# Patient Record
Sex: Female | Born: 1960 | Race: White | Hispanic: No | Marital: Single | Smoking: Never smoker
Health system: Southern US, Community
[De-identification: ages and names within clinical notes are randomized; demographics above are authoritative.]

## PROBLEM LIST (undated history)

## (undated) DIAGNOSIS — R3915 Urgency of urination: Secondary | ICD-10-CM

## (undated) DIAGNOSIS — F349 Persistent mood [affective] disorder, unspecified: Secondary | ICD-10-CM

## (undated) DIAGNOSIS — F70 Mild intellectual disabilities: Secondary | ICD-10-CM

## (undated) DIAGNOSIS — G809 Cerebral palsy, unspecified: Secondary | ICD-10-CM

---

## 2006-06-07 ENCOUNTER — Ambulatory Visit (HOSPITAL_COMMUNITY): Admission: RE | Admit: 2006-06-07 | Discharge: 2006-06-07 | Payer: Self-pay | Admitting: *Deleted

## 2006-07-13 ENCOUNTER — Encounter: Admission: RE | Admit: 2006-07-13 | Discharge: 2006-07-13 | Payer: Self-pay | Admitting: *Deleted

## 2007-08-02 IMAGING — MG MM DIAGNOSTIC LTD LEFT
2 series · 2 of 2 positions shown · non-contrast
Comparison: Patient's prior exams were obtained in [facility] but she does not know the 
facility.

[REDACTED] LEFT
CC and MLO view(s) were taken of the left breast.

LEFT BREAST ULTRASOUND
DIGITAL LIMITED LEFT DIAGNOSTIC MAMMOGRAM AND LEFT BREAST ULTRASOUND:
CLINICAL DATA: Screening call-back for left breast mass.

[L CC]
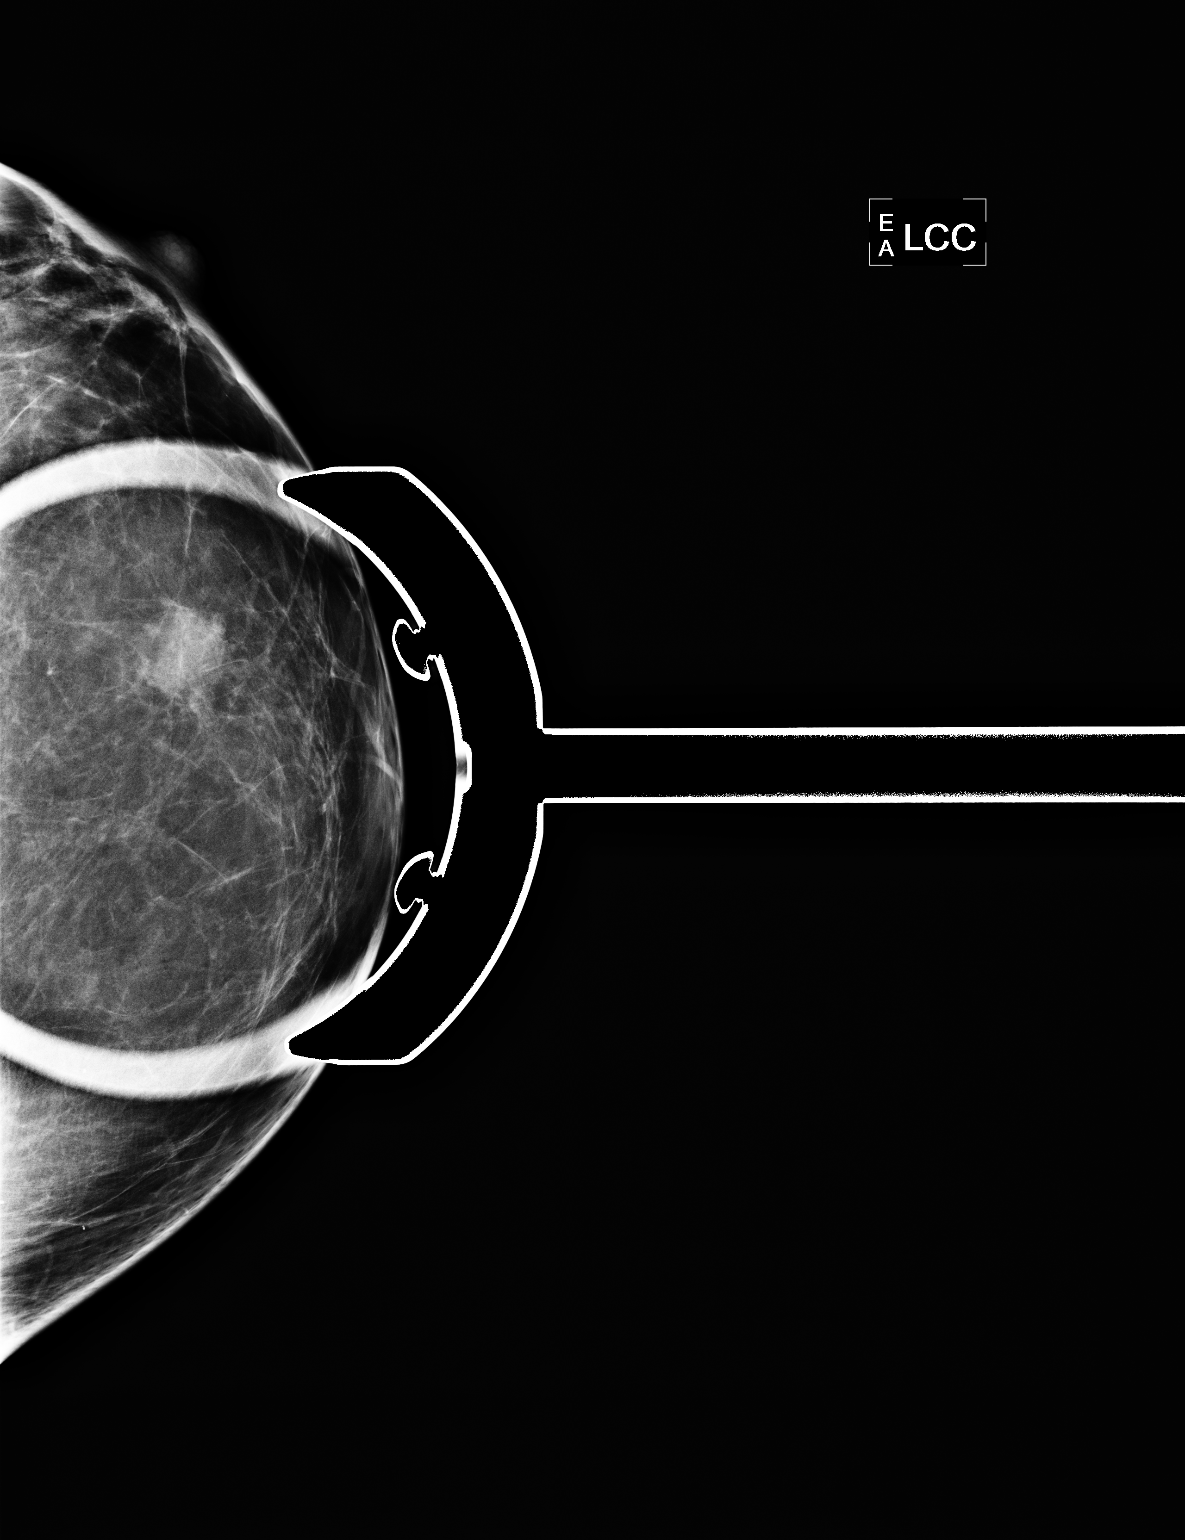

[L MLO]
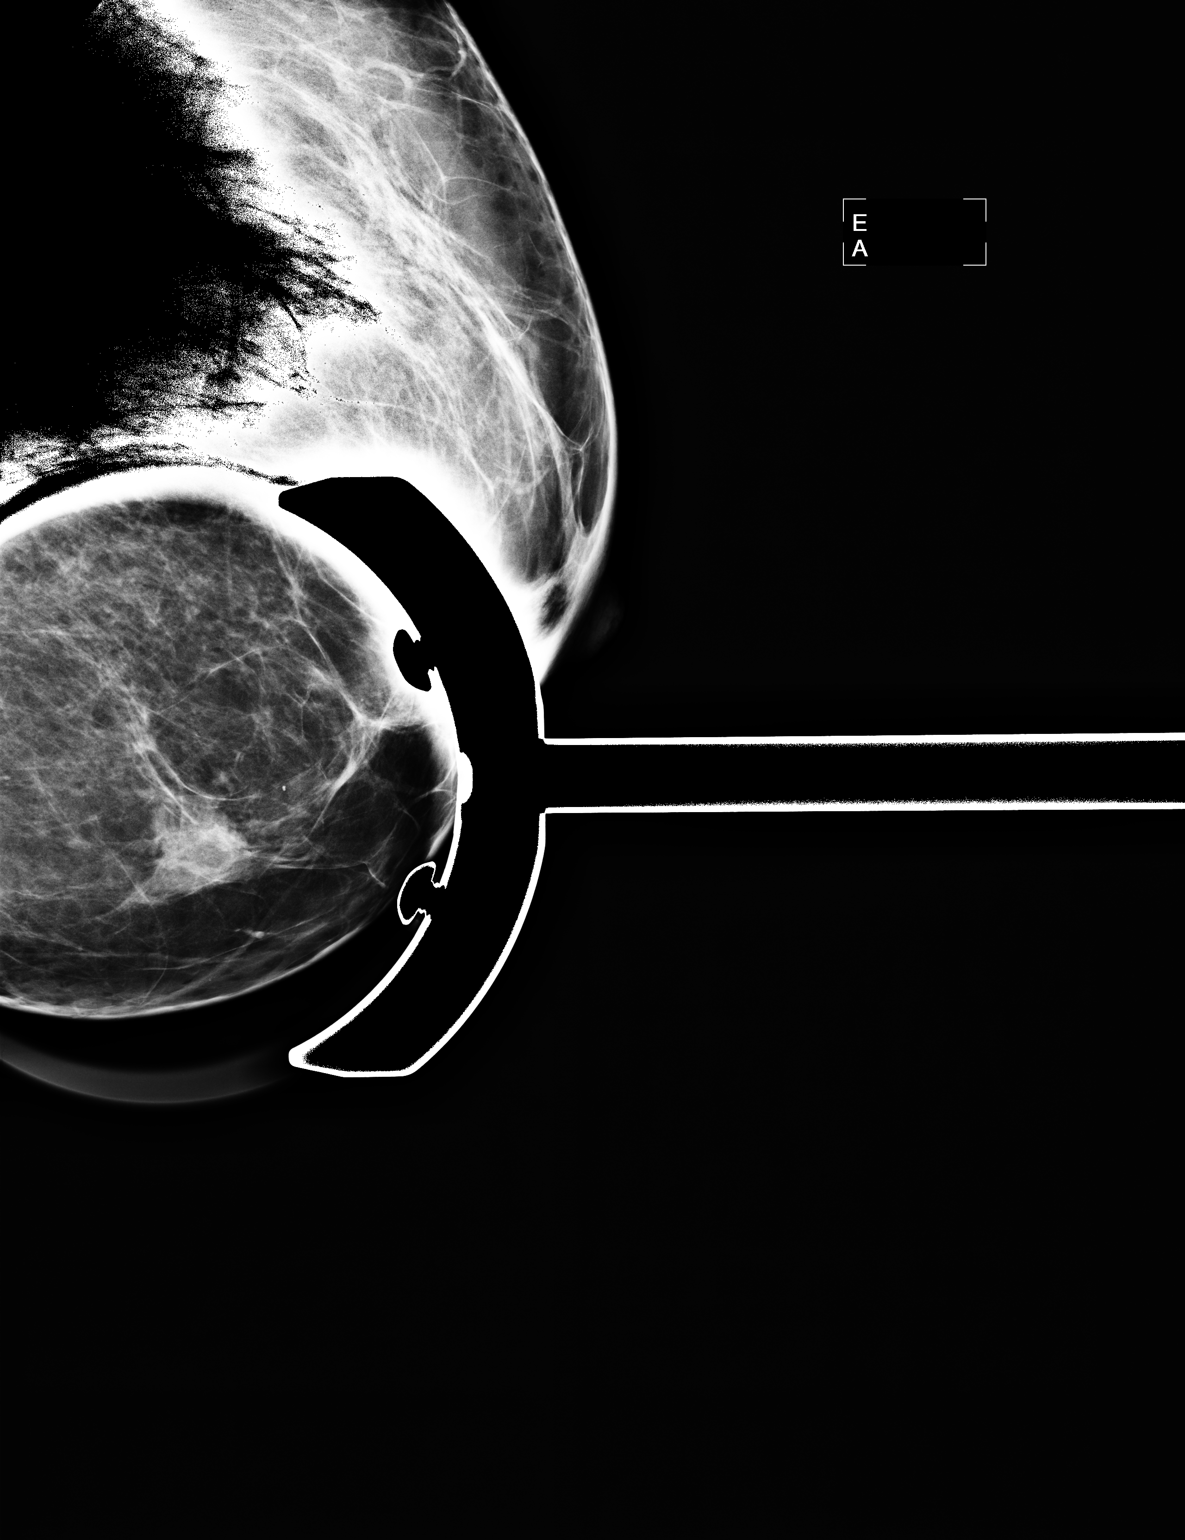

[2 of 2 positions shown; findings below may reference images not displayed]

She has asked us to contact her mother for information regarding finding these films.  
Comparison is made to screening mammogram at [facility] [HOSPITAL[facility]  06-07-06.

There is a benign-appearing intramammary lymph node in the left breast 9 o'clock location.  A
cm gently lobulated oval mass is present in the left breast 7-8 o'clock location.

Ultrasound of the left breast 7-8 o'clock location demonstrates a heterogeneous mass with dense 
posterior shadowing measuring 1.2 x 1.0 x 1.0 cm.  This corresponds to the mammographic finding.
IMPRESSION: Suspicious mass, left breast 8 o'clock location.  We will attempt to obtain outside films to 
determine interval stability.  If they cannot be obtained or if this is a new finding, 
ultrasound-guided biopsy would be recommended. The patient could not stay for this procedure today 
at any rate, and it is unclear at this point whether she can provide informed consent.  Findings 
and recommendations discussed with Dr. Ebony by Dr. Pearl on 07-13-06 at [DATE] p.m.

REVIEW OF OUTSIDE STUDY
CC and MLO view(s) were taken of the left breast.
Radiologist: Mohammadasif Syad, M.D. at this facility

Review Of Outside Study:

Since the prior exam, outside studies from [[facility]ED] [facility], [place] 03-14-02 have
become available for comparison.  The mass in the left lower inner quadrant is unchanged since 
that time. Given its greater than two year interval stability, no specific follow-up is required of
this area.  The patient will be due for screening mammography in one year.

Findings called to Maignal Labate, the patient's mother and guardian, by Dr. Pearl on 07-27-06.

ASSESSMENT: Negative - BI-RADS 1

Screening mammogram of both breasts in 1 year.
,

## 2007-12-06 ENCOUNTER — Encounter: Admission: RE | Admit: 2007-12-06 | Discharge: 2007-12-06 | Payer: Self-pay | Admitting: *Deleted

## 2009-02-04 ENCOUNTER — Encounter: Admission: RE | Admit: 2009-02-04 | Discharge: 2009-02-04 | Payer: Self-pay | Admitting: Family Medicine

## 2009-02-16 ENCOUNTER — Ambulatory Visit: Payer: Self-pay | Admitting: Surgery

## 2010-02-22 ENCOUNTER — Encounter: Admission: RE | Admit: 2010-02-22 | Discharge: 2010-02-22 | Payer: Self-pay | Admitting: Family Medicine

## 2011-01-16 ENCOUNTER — Encounter: Payer: Self-pay | Admitting: *Deleted

## 2011-01-16 ENCOUNTER — Encounter: Payer: Self-pay | Admitting: Family Medicine

## 2011-03-16 ENCOUNTER — Other Ambulatory Visit (HOSPITAL_COMMUNITY): Payer: Self-pay | Admitting: Family Medicine

## 2011-03-16 DIAGNOSIS — Z1231 Encounter for screening mammogram for malignant neoplasm of breast: Secondary | ICD-10-CM

## 2011-03-22 ENCOUNTER — Ambulatory Visit (HOSPITAL_COMMUNITY)
Admission: RE | Admit: 2011-03-22 | Discharge: 2011-03-22 | Disposition: A | Discharge status: Home / Self Care | Payer: Medicaid Other | Source: Ambulatory Visit | Attending: Family Medicine | Admitting: Family Medicine

## 2011-03-22 DIAGNOSIS — Z1231 Encounter for screening mammogram for malignant neoplasm of breast: Secondary | ICD-10-CM | POA: Insufficient documentation

## 2011-03-25 ENCOUNTER — Other Ambulatory Visit: Payer: Self-pay | Admitting: Family Medicine

## 2011-03-25 DIAGNOSIS — R928 Other abnormal and inconclusive findings on diagnostic imaging of breast: Secondary | ICD-10-CM

## 2011-04-05 ENCOUNTER — Ambulatory Visit
Admission: RE | Admit: 2011-04-05 | Discharge: 2011-04-05 | Disposition: A | Discharge status: Home / Self Care | Payer: Medicaid Other | Source: Ambulatory Visit | Attending: Family Medicine | Admitting: Family Medicine

## 2011-04-05 DIAGNOSIS — R928 Other abnormal and inconclusive findings on diagnostic imaging of breast: Secondary | ICD-10-CM

## 2011-05-10 NOTE — Assessment & Plan Note (Signed)
OFFICE VISIT   Gregory, Emily K  DOB:  05/08/1961                                       02/16/2009  ZOXWR#:[id number]   REASON FOR EVALUATION:  Blue feet.   HISTORY:  This is a 50 year old female resident at [facility] with  history of cerebral palsy and bound to a wheelchair.  I am asked to  evaluate her for cool blue extremities and to rule out peripheral  vascular disease.  The patient has poor muscle tone due to her cerebral  palsy.  She has been complaining of several months of her feet being  cold and blue.  She says that they do not hurt her.  There is no  numbness or tingling, there is mild swelling.  She has been trying TED  hose and compression stockings.   PAST MEDICAL HISTORY:  Cerebral palsy.   FAMILY HISTORY:  Noncontributory.   SOCIAL HISTORY:  She is single.  Does not smoke.  Does not drink.   REVIEW OF SYSTEMS:  All negative except for depression.   MEDICATIONS:  Vitamin E, Vitamin C, aspirin, Celexa, Caltrate.   ALLERGIES:  None.   EXAMINATION:  She is well-appearing, in no distress.  Cardiovascular:  Regular rate and rhythm, respirations nonlabored.  Extremities:  Cool to  the touch, I could palpate dorsalis pedis pulses bilaterally.  There is  no ulceration.  She can wiggle her toes.  She can feel her toes.   Diagnostic studies:  A duplex ultrasound was performed today which  reveals an ankle brachial index of 1.06 on the right and 1.15 on the  left.   ASSESSMENT/PLAN:  Blue cold feet.   I think the patient's discoloration and temper discrepancies on her feet  are certainly not related to any form of arterial insufficiency.  She  most likely has an autonomic nervous system dysfunction.  I do not think  this will cause her any problems since she does not have any pain.  We  could potentially treat this like to Raynaud's syndrome with calcium  channel blockers; however, I do not think her symptoms are severe enough  to  warrant this at this time.  Therefore, I would not recommend any  intervention at this time.  Obviously if she does have some swelling,  compression stockings would help.  Otherwise, she should try to avoid  cold environments.  She should keep her feet warm whenever possible.   Jorge Ny, MD  Electronically Signed   VWB/MEDQ  D:  02/16/2009  T:  02/17/2009  Job:  [id number]   cc:   Judge Stall, Dr.

## 2012-04-13 ENCOUNTER — Other Ambulatory Visit: Payer: Self-pay | Admitting: Family Medicine

## 2012-04-13 DIAGNOSIS — Z780 Asymptomatic menopausal state: Secondary | ICD-10-CM

## 2012-04-13 DIAGNOSIS — Z1231 Encounter for screening mammogram for malignant neoplasm of breast: Secondary | ICD-10-CM

## 2012-04-24 IMAGING — US UNKNOWN PR STUDY
1 series · 2 of 2 positions shown · non-contrast
Comparison: 03/22/2011, 02/22/2010, 02/04/2009, 12/06/2007.

CLINICAL DATA: Possible mass subareolar right breast identified on
recent screening mammogram.

DIGITAL DIAGNOSTIC RIGHT MAMMOGRAM WITHOUT CAD AND RIGHT BREAST
ULTRASOUND:

[Series 1: unknown pr study · 2 of 2 slices shown]
[im 1/2]
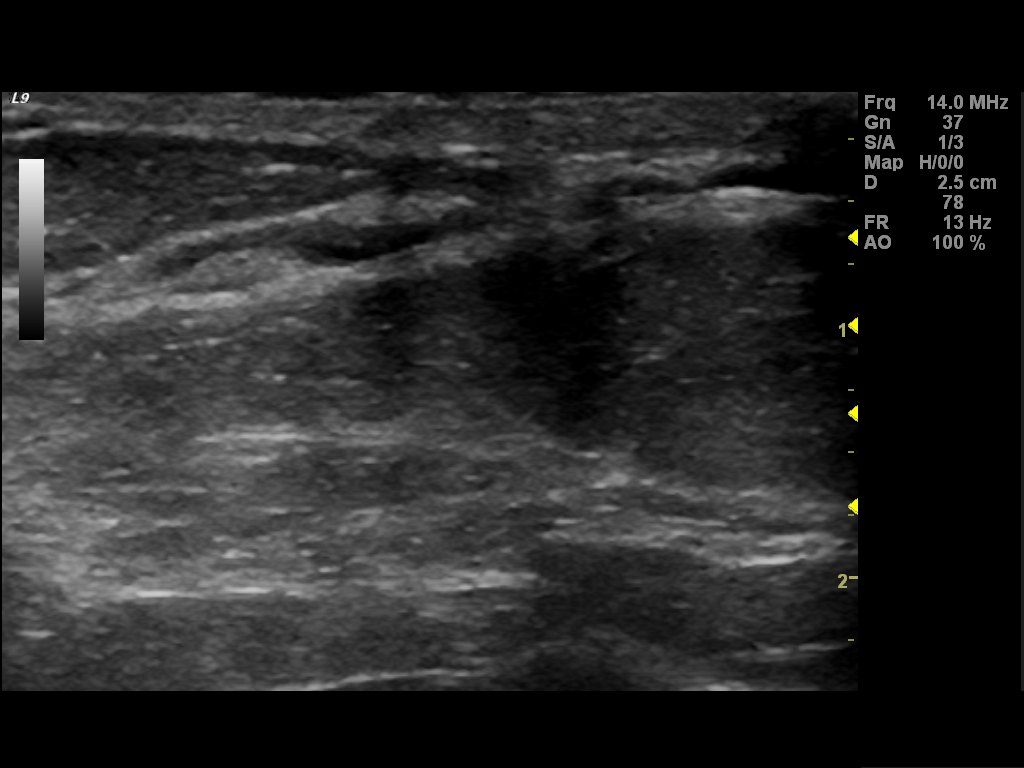
[im 2/2]
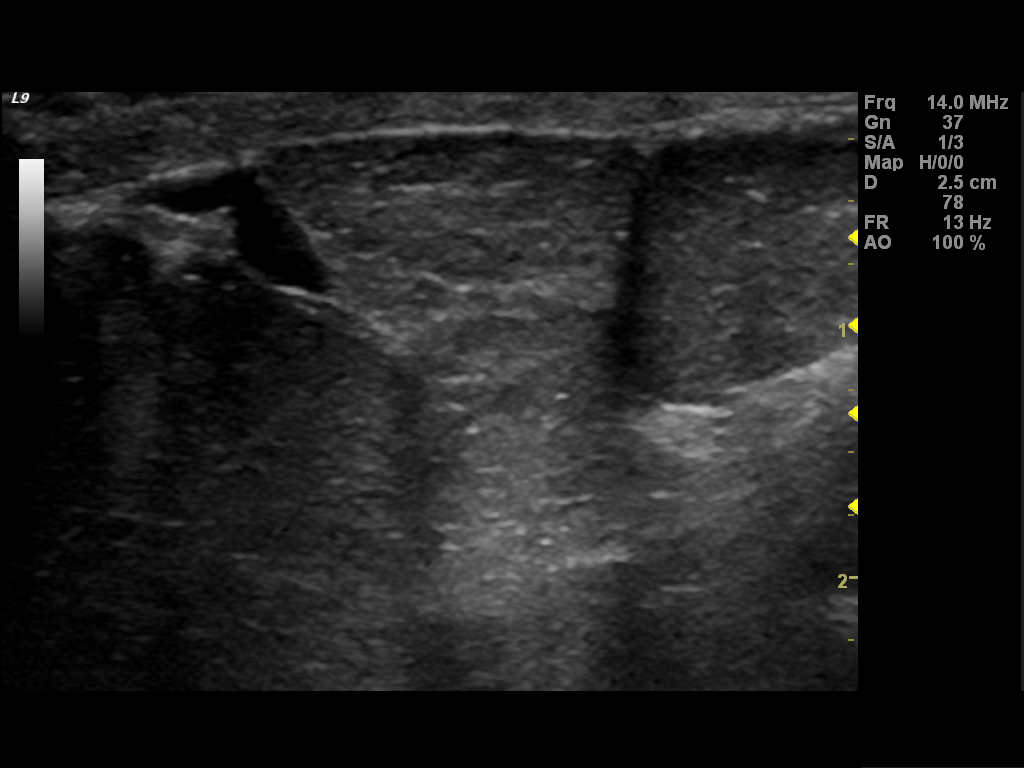

[2 of 2 positions shown; findings below may reference images not displayed]

FINDINGS: Focal spot compression views of the subareolar right
breast slightly lateral to the nipple show probable duct ectasia,
that appears similar compared to prior  prior exams.  No definite
mass is identified.

On physical exam, no mass is palpated in the periareolar right
breast.

Ultrasound is performed, showing mild subareolar duct ectasia.  No
evidence of malignancy.
IMPRESSION: No evidence of malignancy in the right breast.  Mild subareolar
duct ectasia.  Bilateral screening mammogram in 1 year is
recommended.

BI-RADS CATEGORY 2:  Benign finding(s).

## 2012-04-25 ENCOUNTER — Ambulatory Visit: Payer: Medicaid Other

## 2012-04-25 ENCOUNTER — Other Ambulatory Visit: Payer: Medicaid Other

## 2012-04-25 IMAGING — MG MM DIGITAL DIAG LTD R {BCG}
2 series · 2 of 2 positions shown · non-contrast
Comparison: 03/22/2011, 02/22/2010, 02/04/2009, 12/06/2007.

CLINICAL DATA: Possible mass subareolar right breast identified on
recent screening mammogram.

DIGITAL DIAGNOSTIC RIGHT MAMMOGRAM WITHOUT CAD AND RIGHT BREAST
ULTRASOUND:

[R CC]
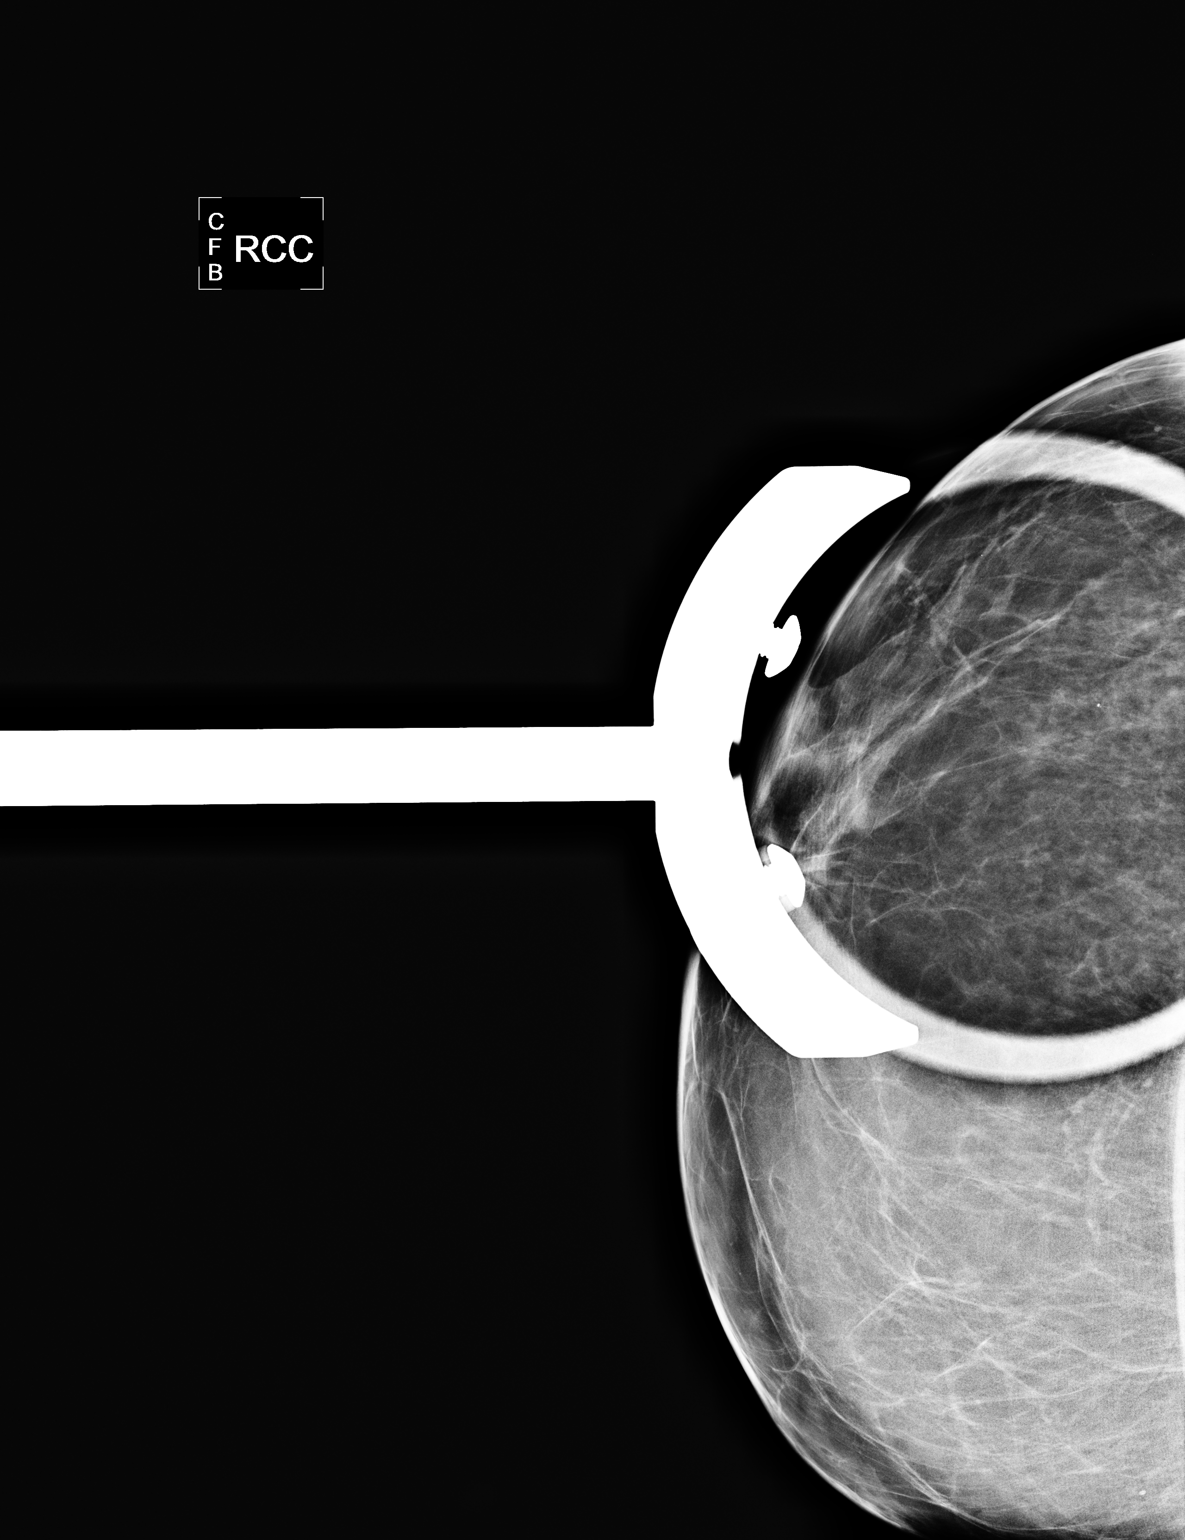

[R MLO]
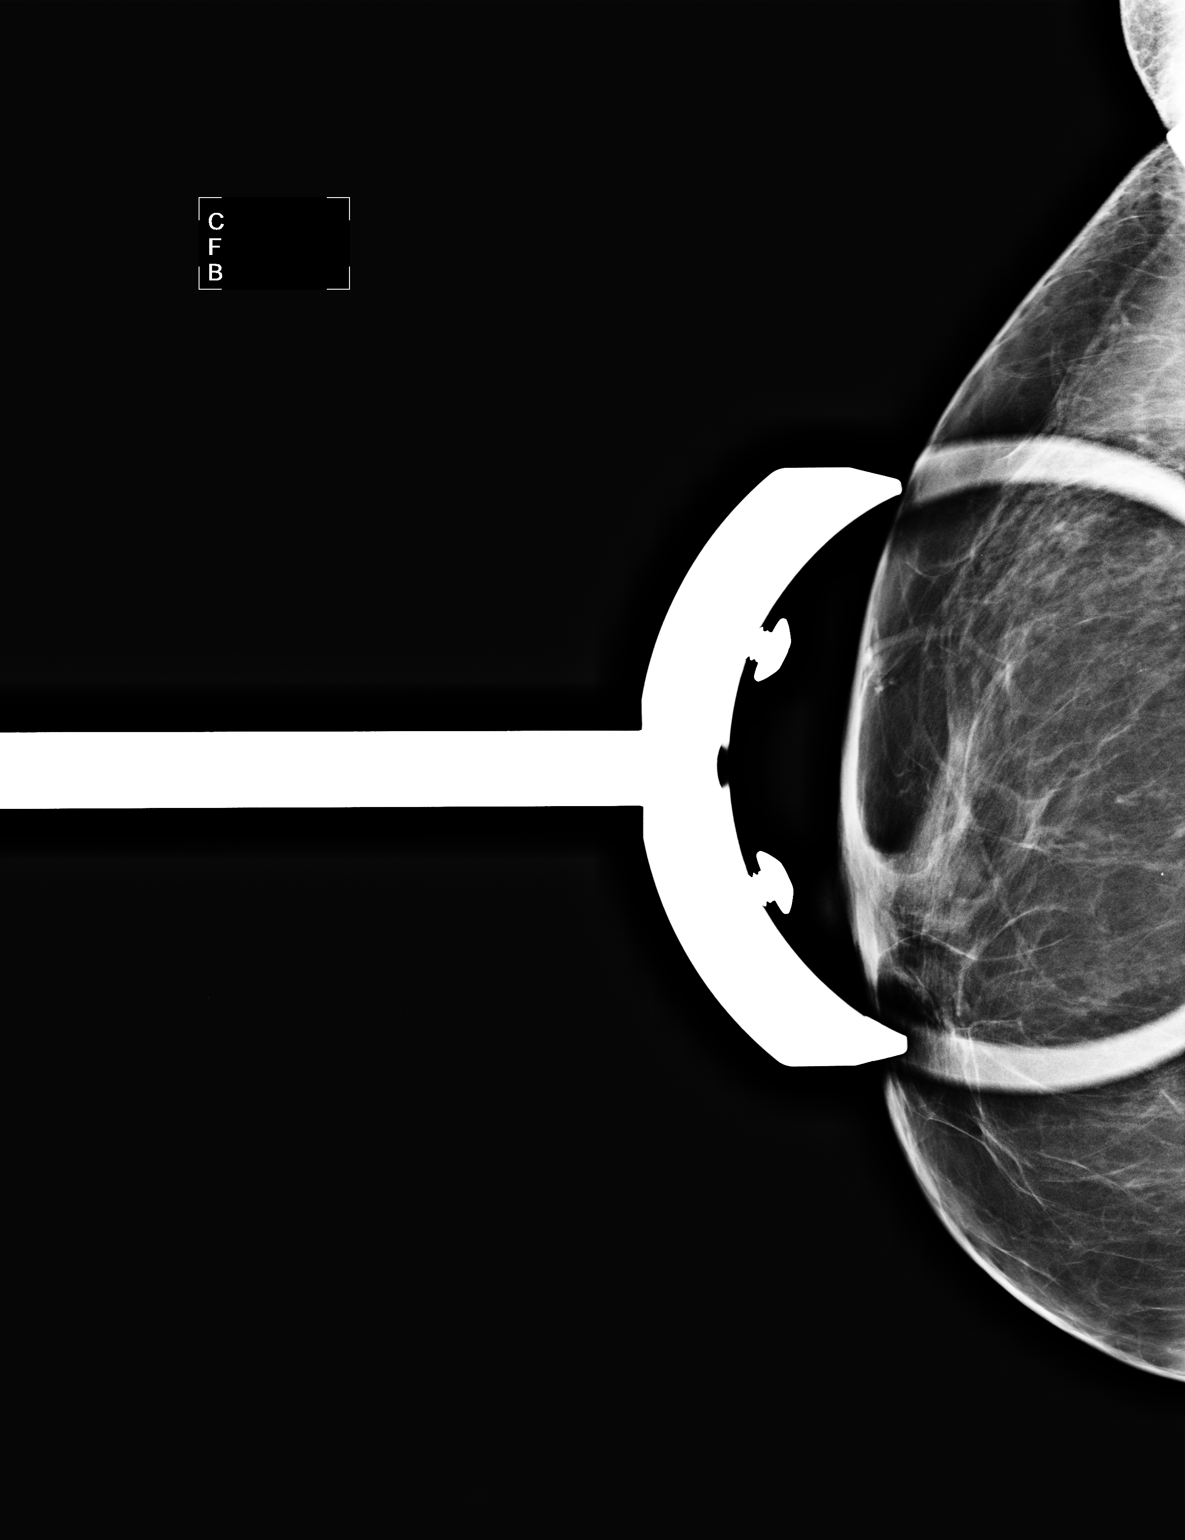

[2 of 2 positions shown; findings below may reference images not displayed]

FINDINGS: Focal spot compression views of the subareolar right
breast slightly lateral to the nipple show probable duct ectasia,
that appears similar compared to prior  prior exams.  No definite
mass is identified.

On physical exam, no mass is palpated in the periareolar right
breast.

Ultrasound is performed, showing mild subareolar duct ectasia.  No
evidence of malignancy.
IMPRESSION: No evidence of malignancy in the right breast.  Mild subareolar
duct ectasia.  Bilateral screening mammogram in 1 year is
recommended.

BI-RADS CATEGORY 2:  Benign finding(s).

## 2012-04-27 ENCOUNTER — Inpatient Hospital Stay: Admission: RE | Admit: 2012-04-27 | Payer: Medicaid Other | Source: Ambulatory Visit

## 2012-04-27 ENCOUNTER — Ambulatory Visit: Payer: Medicaid Other

## 2012-05-11 ENCOUNTER — Other Ambulatory Visit: Payer: Medicaid Other

## 2012-05-11 ENCOUNTER — Ambulatory Visit: Payer: Medicaid Other

## 2012-06-22 ENCOUNTER — Ambulatory Visit
Admission: RE | Admit: 2012-06-22 | Discharge: 2012-06-22 | Disposition: A | Discharge status: Home / Self Care | Payer: Medicare Other | Source: Ambulatory Visit | Attending: Family Medicine | Admitting: Family Medicine

## 2012-06-22 DIAGNOSIS — Z780 Asymptomatic menopausal state: Secondary | ICD-10-CM

## 2012-06-22 DIAGNOSIS — Z1231 Encounter for screening mammogram for malignant neoplasm of breast: Secondary | ICD-10-CM

## 2014-08-04 ENCOUNTER — Other Ambulatory Visit: Payer: Self-pay | Admitting: Family Medicine

## 2014-08-04 DIAGNOSIS — Z1231 Encounter for screening mammogram for malignant neoplasm of breast: Secondary | ICD-10-CM

## 2014-08-22 ENCOUNTER — Encounter (INDEPENDENT_AMBULATORY_CARE_PROVIDER_SITE_OTHER): Payer: Self-pay

## 2014-08-22 ENCOUNTER — Ambulatory Visit
Admission: RE | Admit: 2014-08-22 | Discharge: 2014-08-22 | Disposition: A | Discharge status: Home / Self Care | Payer: Medicare Other | Source: Ambulatory Visit | Attending: Family Medicine | Admitting: Family Medicine

## 2014-08-22 DIAGNOSIS — Z1231 Encounter for screening mammogram for malignant neoplasm of breast: Secondary | ICD-10-CM

## 2017-07-26 ENCOUNTER — Emergency Department (HOSPITAL_COMMUNITY)
Admission: EM | Admit: 2017-07-26 | Discharge: 2017-07-26 | Disposition: A | Discharge status: Home / Self Care | Payer: Medicare Other | Attending: Emergency Medicine | Admitting: Emergency Medicine

## 2017-07-26 ENCOUNTER — Encounter (HOSPITAL_COMMUNITY): Payer: Self-pay | Admitting: *Deleted

## 2017-07-26 DIAGNOSIS — R42 Dizziness and giddiness: Secondary | ICD-10-CM | POA: Diagnosis present

## 2017-07-26 DIAGNOSIS — R112 Nausea with vomiting, unspecified: Secondary | ICD-10-CM | POA: Diagnosis not present

## 2017-07-26 DIAGNOSIS — G809 Cerebral palsy, unspecified: Secondary | ICD-10-CM | POA: Insufficient documentation

## 2017-07-26 HISTORY — DX: Cerebral palsy, unspecified: G80.9

## 2017-07-26 HISTORY — DX: Persistent mood (affective) disorder, unspecified: F34.9

## 2017-07-26 HISTORY — DX: Mild intellectual disabilities: F70

## 2017-07-26 HISTORY — DX: Urgency of urination: R39.15

## 2017-07-26 NOTE — ED Provider Notes (Signed)
[facility]-EMERGENCY DEPT Provider Note   CSN: [id number] Arrival date & time: 07/26/17  1248     History   Chief Complaint Chief Complaint  Patient presents with  . Dizziness  . Emesis    HPI Emily Gregory is a 56 y.o. female.  Pt w hx cp, presents via ems, pt was noted to have single episode emesis this AM, and bp then was reported 84/50 - was noted to improve to 130/ with small fluids bolus, 150 ml.  Pt currently states feels fine. No nausea. No abdominal pain. Denies diarrhea. Denies fever or chills. No chest pain or sob.   The history is provided by the patient and the EMS personnel.  Dizziness  Associated symptoms: vomiting   Associated symptoms: no chest pain, no headaches and no shortness of breath   Emesis   Pertinent negatives include no abdominal pain, no cough, no fever and no headaches.    Past Medical History:  Diagnosis Date  . CP (cerebral palsy) (HCC)   . Mild intellectual disabilities   . Persistent mood disorder (HCC)   . Urinary urgency     There are no active problems to display for this patient.   History reviewed. No pertinent surgical history.  OB History    No data available       Home Medications    Prior to Admission medications   Not on File    Family History No family history on file.  Social History Social History  Substance Use Topics  . Smoking status: Never Smoker  . Smokeless tobacco: Never Used  . Alcohol use No     Allergies   Alcohol-sulfur [sulfur] and Claritin [loratadine]   Review of Systems Review of Systems  Constitutional: Negative for fever.  HENT: Negative for sore throat.   Eyes: Negative for redness.  Respiratory: Negative for cough and shortness of breath.   Cardiovascular: Negative for chest pain.  Gastrointestinal: Positive for vomiting. Negative for abdominal pain.  Genitourinary: Negative for dysuria and flank pain.  Musculoskeletal: Negative for back pain.  Skin: Negative for rash.    Neurological: Negative for headaches.  Hematological: Does not bruise/bleed easily.  Psychiatric/Behavioral: Negative for confusion.     Physical Exam Updated Vital Signs BP 106/73 (BP Location: Right Arm)   Pulse (!) 52   Temp 97.8 F (36.6 C) (Oral)   Resp 16   Ht 1.702 m (5\' 7" )   LMP 03/17/2011   SpO2 98%   Physical Exam  Constitutional: She appears well-developed and well-nourished. No distress.  HENT:  Mouth/Throat: Oropharynx is clear and moist.  Eyes: Conjunctivae are normal. No scleral icterus.  Neck: Neck supple. No tracheal deviation present.  Cardiovascular: Normal rate, regular rhythm, normal heart sounds and intact distal pulses.   Pulmonary/Chest: Effort normal and breath sounds normal. No respiratory distress.  Abdominal: Soft. Normal appearance and bowel sounds are normal. She exhibits no distension. There is no tenderness.  Genitourinary:  Genitourinary Comments: No cva tenderness  Musculoskeletal: She exhibits no edema.  Neurological: She is alert.  Skin: Skin is warm and dry. No rash noted. She is not diaphoretic.  Psychiatric: She has a normal mood and affect.  Nursing note and vitals reviewed.    ED Treatments / Results  Labs (all labs ordered are listed, but only abnormal results are displayed) Labs Reviewed - No data to display  EKG  EKG Interpretation None       Radiology No results found.  Procedures Procedures (including  critical care time)  Medications Ordered in ED Medications - No data to display   Initial Impression / Assessment and Plan / ED Course  I have reviewed the triage vital signs and the nursing notes.  Pertinent labs & imaging results that were available during my care of the patient were reviewed by me and considered in my medical decision making (see chart for details).  BP in ED normal. Afeb.   Trial of po fluids, snack.  Reviewed nursing notes and prior charts for additional history.   Recheck, bp  normal  Tolerating po.   Pt currently appears stable for d/c.  Return precautions provided.      Final Clinical Impressions(s) / ED Diagnoses   Final diagnoses:  None    New Prescriptions New Prescriptions   No medications on file     Cathren LaineSteinl, Kynzley Dowson, MD 07/26/17 1459

## 2017-07-26 NOTE — ED Notes (Signed)
Pt eating crackers and peanut butter without nausea

## 2017-07-26 NOTE — ED Triage Notes (Signed)
Pt here from [facility] for dizziness and emesis x 1 since 7 am.  Initial bp 84/50 that increased to 130/palp after 150 ml.  HR 50.  CBG 152.  EKG NSR.  20 L hand.

## 2017-07-26 NOTE — Discharge Instructions (Signed)
It was our pleasure to provide your ER care today - we hope that you feel better.  Rest. Drink plenty of fluids.   Follow up with primary care doctor in the next 1-2 days if symptoms fail to improve/resolve.  Return to ER if worse, persistent vomiting, fevers, abdominal pain, other concern.

## 2018-10-04 ENCOUNTER — Other Ambulatory Visit: Payer: Self-pay | Admitting: Nurse Practitioner

## 2018-10-04 DIAGNOSIS — Z1231 Encounter for screening mammogram for malignant neoplasm of breast: Secondary | ICD-10-CM

## 2018-10-31 ENCOUNTER — Other Ambulatory Visit: Payer: Self-pay | Admitting: Geriatric Medicine

## 2018-10-31 DIAGNOSIS — Z1231 Encounter for screening mammogram for malignant neoplasm of breast: Secondary | ICD-10-CM

## 2018-11-13 ENCOUNTER — Ambulatory Visit: Payer: Medicare Other

## 2019-01-24 ENCOUNTER — Ambulatory Visit
Admission: RE | Admit: 2019-01-24 | Discharge: 2019-01-24 | Disposition: A | Discharge status: Home / Self Care | Payer: Medicare Other | Source: Ambulatory Visit | Attending: Geriatric Medicine | Admitting: Geriatric Medicine

## 2019-01-24 DIAGNOSIS — Z1231 Encounter for screening mammogram for malignant neoplasm of breast: Secondary | ICD-10-CM

## 2020-04-06 ENCOUNTER — Encounter: Payer: Self-pay | Admitting: *Deleted

## 2020-04-30 ENCOUNTER — Other Ambulatory Visit: Payer: Self-pay | Admitting: Geriatric Medicine

## 2020-04-30 DIAGNOSIS — Z1231 Encounter for screening mammogram for malignant neoplasm of breast: Secondary | ICD-10-CM

## 2020-05-12 ENCOUNTER — Encounter: Payer: Medicare Other | Admitting: Obstetrics and Gynecology

## 2020-05-14 ENCOUNTER — Ambulatory Visit
Admission: RE | Admit: 2020-05-14 | Discharge: 2020-05-14 | Disposition: A | Discharge status: Home / Self Care | Payer: Medicare Other | Source: Ambulatory Visit | Attending: Geriatric Medicine | Admitting: Geriatric Medicine

## 2020-05-14 ENCOUNTER — Other Ambulatory Visit: Payer: Self-pay

## 2020-05-14 DIAGNOSIS — Z1231 Encounter for screening mammogram for malignant neoplasm of breast: Secondary | ICD-10-CM

## 2021-03-30 ENCOUNTER — Other Ambulatory Visit: Payer: Self-pay | Admitting: Geriatric Medicine

## 2021-03-30 DIAGNOSIS — Z1231 Encounter for screening mammogram for malignant neoplasm of breast: Secondary | ICD-10-CM

## 2021-05-27 ENCOUNTER — Other Ambulatory Visit: Payer: Self-pay

## 2021-05-27 ENCOUNTER — Ambulatory Visit
Admission: RE | Admit: 2021-05-27 | Discharge: 2021-05-27 | Disposition: A | Discharge status: Home / Self Care | Payer: Medicare Other | Source: Ambulatory Visit | Attending: Geriatric Medicine | Admitting: Geriatric Medicine

## 2021-05-27 DIAGNOSIS — Z1231 Encounter for screening mammogram for malignant neoplasm of breast: Secondary | ICD-10-CM

## 2022-02-18 ENCOUNTER — Other Ambulatory Visit: Payer: Self-pay | Admitting: Geriatric Medicine

## 2022-02-18 DIAGNOSIS — Z1231 Encounter for screening mammogram for malignant neoplasm of breast: Secondary | ICD-10-CM

## 2022-06-09 ENCOUNTER — Ambulatory Visit
Admission: RE | Admit: 2022-06-09 | Discharge: 2022-06-09 | Disposition: A | Discharge status: Home / Self Care | Payer: Medicare Other | Source: Ambulatory Visit | Attending: Geriatric Medicine | Admitting: Geriatric Medicine

## 2022-06-09 DIAGNOSIS — Z1231 Encounter for screening mammogram for malignant neoplasm of breast: Secondary | ICD-10-CM

## 2023-01-03 ENCOUNTER — Other Ambulatory Visit: Payer: Self-pay | Admitting: Student

## 2023-01-03 DIAGNOSIS — Z1231 Encounter for screening mammogram for malignant neoplasm of breast: Secondary | ICD-10-CM

## 2023-06-20 ENCOUNTER — Ambulatory Visit
Admission: RE | Admit: 2023-06-20 | Discharge: 2023-06-20 | Disposition: A | Discharge status: Home / Self Care | Payer: Medicare Other | Source: Ambulatory Visit | Attending: Student | Admitting: Student

## 2023-06-20 DIAGNOSIS — Z1231 Encounter for screening mammogram for malignant neoplasm of breast: Secondary | ICD-10-CM

## 2023-06-23 ENCOUNTER — Other Ambulatory Visit: Payer: Self-pay | Admitting: Student

## 2023-06-23 DIAGNOSIS — R928 Other abnormal and inconclusive findings on diagnostic imaging of breast: Secondary | ICD-10-CM

## 2023-07-04 ENCOUNTER — Inpatient Hospital Stay: Admission: RE | Admit: 2023-07-04 | Payer: Medicare Other | Source: Ambulatory Visit

## 2023-07-20 ENCOUNTER — Other Ambulatory Visit: Payer: Medicare Other

## 2023-08-01 ENCOUNTER — Ambulatory Visit
Admission: RE | Admit: 2023-08-01 | Discharge: 2023-08-01 | Disposition: A | Discharge status: Home / Self Care | Payer: Medicare Other | Source: Ambulatory Visit | Attending: Student | Admitting: Student

## 2023-08-01 DIAGNOSIS — R928 Other abnormal and inconclusive findings on diagnostic imaging of breast: Secondary | ICD-10-CM

## 2023-08-02 ENCOUNTER — Other Ambulatory Visit: Payer: Self-pay | Admitting: Student

## 2023-08-02 DIAGNOSIS — N6320 Unspecified lump in the left breast, unspecified quadrant: Secondary | ICD-10-CM

## 2023-08-03 ENCOUNTER — Other Ambulatory Visit: Payer: Medicare Other

## 2024-02-08 ENCOUNTER — Other Ambulatory Visit: Payer: Self-pay | Admitting: Nurse Practitioner

## 2024-02-08 DIAGNOSIS — N6320 Unspecified lump in the left breast, unspecified quadrant: Secondary | ICD-10-CM

## 2024-02-09 ENCOUNTER — Other Ambulatory Visit: Payer: Self-pay | Admitting: Nurse Practitioner

## 2024-02-09 DIAGNOSIS — N6320 Unspecified lump in the left breast, unspecified quadrant: Secondary | ICD-10-CM

## 2024-02-13 ENCOUNTER — Other Ambulatory Visit: Payer: Medicare Other

## 2024-03-21 ENCOUNTER — Ambulatory Visit

## 2024-03-21 ENCOUNTER — Ambulatory Visit
Admission: RE | Admit: 2024-03-21 | Discharge: 2024-03-21 | Disposition: A | Discharge status: Home / Self Care | Source: Ambulatory Visit | Attending: Nurse Practitioner | Admitting: Nurse Practitioner

## 2024-03-21 DIAGNOSIS — N6320 Unspecified lump in the left breast, unspecified quadrant: Secondary | ICD-10-CM

## 2024-04-26 ENCOUNTER — Encounter: Payer: Self-pay | Admitting: Student

## 2024-04-26 ENCOUNTER — Other Ambulatory Visit: Payer: Self-pay | Admitting: Student

## 2024-04-26 DIAGNOSIS — Z0000 Encounter for general adult medical examination without abnormal findings: Secondary | ICD-10-CM

## 2024-04-26 DIAGNOSIS — Z1231 Encounter for screening mammogram for malignant neoplasm of breast: Secondary | ICD-10-CM

## 2024-06-20 ENCOUNTER — Ambulatory Visit
Admission: RE | Admit: 2024-06-20 | Discharge: 2024-06-20 | Disposition: A | Discharge status: Home / Self Care | Source: Ambulatory Visit | Attending: Student | Admitting: Student

## 2024-06-20 DIAGNOSIS — Z1231 Encounter for screening mammogram for malignant neoplasm of breast: Secondary | ICD-10-CM
